# Patient Record
Sex: Male | Born: 2005 | Race: White | Hispanic: No | Marital: Single | Smoking: Never smoker
Health system: Southern US, Community
[De-identification: ages and names within clinical notes are randomized; demographics above are authoritative.]

## PROBLEM LIST (undated history)

## (undated) DIAGNOSIS — F840 Autistic disorder: Secondary | ICD-10-CM

## (undated) DIAGNOSIS — F909 Attention-deficit hyperactivity disorder, unspecified type: Secondary | ICD-10-CM

## (undated) HISTORY — DX: Attention-deficit hyperactivity disorder, unspecified type: F90.9

## (undated) HISTORY — DX: Autistic disorder: F84.0

---
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
## 2024-01-20 ENCOUNTER — Ambulatory Visit: Admitting: Family

## 2024-01-20 ENCOUNTER — Encounter: Payer: Self-pay | Admitting: Family

## 2024-01-20 VITALS — BP 108/72 | HR 76 | Ht 70.0 in | Wt 262.6 lb

## 2024-01-20 DIAGNOSIS — L0591 Pilonidal cyst without abscess: Secondary | ICD-10-CM | POA: Diagnosis not present

## 2024-01-20 MED ORDER — AMOXICILLIN-POT CLAVULANATE 875-125 MG PO TABS: 1.0000 | ORAL_TABLET | Freq: Two times a day (BID) | ORAL | 0 refills | Status: AC

## 2024-01-20 NOTE — Progress Notes (Signed)
  Matthew Bentley is a 18 y.o. 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 14 tablet 0   No current facility-administered medications for this visit.    Allergies: Patient has no known allergies.  Past Medical History:  Diagnosis Date   ADHD (attention deficit hyperactivity disorder)    Autism     No past surgical history on file.  No family history on file.  Social History   Tobacco Use   Smoking status: Never   Smokeless tobacco: Never  Substance Use Topics   Alcohol use: No    Subjective:   Presents today as a new patient; accompanied by mother; seen at [facility] 2 months ago and diagnosed with pilonidal cyst; was treated with course of antibiotics; unfortunately, symptoms have persisted and area of concern continuing to drain;  Works full time in land scaping; no fever;   Objective:  Vitals:   01/20/24 1459  BP: 108/72  Pulse: 76  SpO2: 98%  Weight: 262 lb 9.6 oz (119.1 kg)  Height: 5' 10 (1.778 m)    General: Well developed, well nourished, in no acute distress  Skin : Warm and dry. C/w pilonidal cyst noted at base of tailbone Head: Normocephalic and atraumatic  Lungs: Respirations unlabored;  Neurologic: Alert and oriented; speech intact; face symmetrical; moves all extremities well; CNII-XII intact without focal deficit   Assessment:  1. Pilonidal cyst     Plan:  Rx for Augmentin  875 mg bid x 7 days; referral to general surgery; follow up for CPE after surgical consult completed;   No follow-ups on file.  Orders Placed This Encounter  Procedures   Ambulatory referral to  General Surgery    Referral Priority:   Emergency    Referral Type:   Surgical    Referral Reason:   Specialty Services Required    Requested Specialty:   General Surgery    Number of Visits Requested:   1    Requested Prescriptions   Signed Prescriptions Disp Refills   amoxicillin -clavulanate (AUGMENTIN ) 875-125 MG tablet 14 tablet 0    Sig: Take 1 tablet by mouth 2 (two) times daily for 7 days.

## 2024-01-27 ENCOUNTER — Ambulatory Visit: Payer: Self-pay | Admitting: Surgery

## 2024-01-27 DIAGNOSIS — L0591 Pilonidal cyst without abscess: Secondary | ICD-10-CM | POA: Diagnosis not present

## 2024-01-27 NOTE — H&P (Signed)
 Matthew Bentley [id number]   Referring Provider:  Jason Leita Repine,*   Subjective   Chief Complaint: New Consultation (Pilonidal cyst)     History of Present Illness:    18 year old male presents for evaluation of a pilonidal cyst.  This has been treated with antibiotics from an urgent care about 2 months ago and recently established care with a PCP and noted at the time the symptoms had persisted with an ongoing area of drainage.  Another round of antibiotics was initiated on 7/11.  Has not really noticed a difference with this. Denies any prior symptoms or procedures in this area.  He works outdoors in Aeronautical engineer.   Review of Systems: A complete review of systems was obtained from the patient.  I have reviewed this information and discussed as appropriate with the patient.  See HPI as well for other ROS.   Medical History: Past Medical History:  Diagnosis Date   Anxiety     There is no problem list on file for this patient.   History reviewed. No pertinent surgical history.   No Known Allergies  No current outpatient medications on file prior to visit.   No current facility-administered medications on file prior to visit.    History reviewed. No pertinent family history.   Social History   Tobacco Use  Smoking Status Never  Smokeless Tobacco Never     Social History   Socioeconomic History   Marital status: Unknown  Tobacco Use   Smoking status: Never   Smokeless tobacco: Never  Vaping Use   Vaping status: Never Used  Substance and Sexual Activity   Alcohol use: Never   Drug use: Never   Social Drivers of Health   Housing Stability: Unknown (01/27/2024)   Housing Stability Vital Sign    Homeless in the Last Year: No    Objective:    Vitals:   01/27/24 1543  BP: (!) 118/82  Pulse: 102  Temp: 36.7 C (98 F)  SpO2: 97%  Weight: (!) 118.4 kg (261 lb)  Height: 177.8 cm (5' 10)  PainSc: 0-No pain    Body mass index is 37.45  kg/m.  Gen: A&Ox3, no distress  Chest: respiratory effort is normal. Neuro: no gross deficit Psych: appropriate mood and affect, normal insight/judgment intact  Skin: warm and dry.  Approximately 1 cm protuberant area of granulation and mild drainage to the left of the superior natal cleft.  Inferior to this are several midline pits containing hair.    Assessment and Plan:  Diagnoses and all orders for this visit:  Pilonidal cyst    We discussed the etiology and natural history of this disease and options for treatment. I recommend trephination and went over the procedure with the patient in detail as well as typical postoperative recovery and timeline. We discussed risks of bleeding, infection, pain, scarring, wound healing problems, recurrent pilonidal disease. Questions welcomed and answered. The patient wishes to proceed.      Mitzie Freund MD FACS

## 2024-01-27 NOTE — H&P (View-Only) (Signed)
 Matthew Bentley [id number]   Referring Provider:  Jason Leita Repine,*   Subjective   Chief Complaint: New Consultation (Pilonidal cyst)     History of Present Illness:    18 year old male presents for evaluation of a pilonidal cyst.  This has been treated with antibiotics from an urgent care about 2 months ago and recently established care with a PCP and noted at the time the symptoms had persisted with an ongoing area of drainage.  Another round of antibiotics was initiated on 7/11.  Has not really noticed a difference with this. Denies any prior symptoms or procedures in this area.  He works outdoors in Aeronautical engineer.   Review of Systems: A complete review of systems was obtained from the patient.  I have reviewed this information and discussed as appropriate with the patient.  See HPI as well for other ROS.   Medical History: Past Medical History:  Diagnosis Date   Anxiety     There is no problem list on file for this patient.   History reviewed. No pertinent surgical history.   No Known Allergies  No current outpatient medications on file prior to visit.   No current facility-administered medications on file prior to visit.    History reviewed. No pertinent family history.   Social History   Tobacco Use  Smoking Status Never  Smokeless Tobacco Never     Social History   Socioeconomic History   Marital status: Unknown  Tobacco Use   Smoking status: Never   Smokeless tobacco: Never  Vaping Use   Vaping status: Never Used  Substance and Sexual Activity   Alcohol use: Never   Drug use: Never   Social Drivers of Health   Housing Stability: Unknown (01/27/2024)   Housing Stability Vital Sign    Homeless in the Last Year: No    Objective:    Vitals:   01/27/24 1543  BP: (!) 118/82  Pulse: 102  Temp: 36.7 C (98 F)  SpO2: 97%  Weight: (!) 118.4 kg (261 lb)  Height: 177.8 cm (5' 10)  PainSc: 0-No pain    Body mass index is 37.45  kg/m.  Gen: A&Ox3, no distress  Chest: respiratory effort is normal. Neuro: no gross deficit Psych: appropriate mood and affect, normal insight/judgment intact  Skin: warm and dry.  Approximately 1 cm protuberant area of granulation and mild drainage to the left of the superior natal cleft.  Inferior to this are several midline pits containing hair.    Assessment and Plan:  Diagnoses and all orders for this visit:  Pilonidal cyst    We discussed the etiology and natural history of this disease and options for treatment. I recommend trephination and went over the procedure with the patient in detail as well as typical postoperative recovery and timeline. We discussed risks of bleeding, infection, pain, scarring, wound healing problems, recurrent pilonidal disease. Questions welcomed and answered. The patient wishes to proceed.      Mitzie Freund MD FACS

## 2024-02-10 NOTE — Progress Notes (Addendum)
 COVID Vaccine Completed:  Date of COVID positive in last 90 days:  PCP - Leita Elbe, FNP Cardiologist - n/a  Chest x-ray - n/a EKG - n/a Stress Test - n/a ECHO - n/a Cardiac Cath - n/a Pacemaker/ICD device last checked: n/a Spinal Cord Stimulator: n/a  Bowel Prep - no  Sleep Study - n/a CPAP -   Fasting Blood Sugar - n/a Checks Blood Sugar _____ times a day  Last dose of GLP1 agonist-  N/A GLP1 instructions:  Do not take after     Last dose of SGLT-2 inhibitors-  N/A SGLT-2 instructions:  Do not take after     Blood Thinner Instructions:  Last dose: n/a  Time: Aspirin Instructions: Last Dose:  Activity level: Can go up a flight of stairs and perform activities of daily living without stopping and without symptoms of chest pain or shortness of breath.   Anesthesia review:   Patient denies shortness of breath, fever, cough and chest pain at PAT appointment  Patient verbalized understanding of instructions that were given to them at the PAT appointment. Patient was also instructed that they will need to review over the PAT instructions again at home before surgery.

## 2024-02-10 NOTE — Patient Instructions (Addendum)
 SURGICAL WAITING ROOM VISITATION  Patients having surgery or a procedure may have no more than 2 support people in the waiting area - these visitors may rotate.    Children under the age of 55 must have an adult with them who is not the patient.  Visitors with respiratory illnesses are discouraged from visiting and should remain at home.  If the patient needs to stay at the hospital during part of their recovery, the visitor guidelines for inpatient rooms apply. Pre-op nurse will coordinate an appropriate time for 1 support person to accompany patient in pre-op.  This support person may not rotate.    Please refer to the [facility] website for the visitor guidelines for Inpatients (after your surgery is over and you are in a regular room).    Your procedure is scheduled on: 02/21/24   Report to [facility] Main Entrance    Report to admitting at 5:15 AM   Call this number if you have problems the morning of surgery [phone]   Do not eat food or drink liquids :After Midnight.          If you have questions, please contact your surgeon's office.   FOLLOW BOWEL PREP AND ANY ADDITIONAL PRE OP INSTRUCTIONS YOU RECEIVED FROM YOUR SURGEON'S OFFICE!!!     Oral Hygiene is also important to reduce your risk of infection.                                    Remember - BRUSH YOUR TEETH THE MORNING OF SURGERY WITH YOUR REGULAR TOOTHPASTE  DENTURES WILL BE REMOVED PRIOR TO SURGERY PLEASE DO NOT APPLY Poly grip OR ADHESIVES!!!   Stop all vitamins and herbal supplements 7 days before surgery.   Take these medicines the morning of surgery with A SIP OF WATER: None                               You may not have any metal on your body including hair pins, jewelry, and body piercing             Do not wear lotions, powders, cologne, or deodorant              Men may shave face and neck.   Do not bring valuables to the hospital. [facility] IS NOT             RESPONSIBLE   FOR  VALUABLES.   Contacts, glasses, dentures or bridgework may not be worn into surgery.  DO NOT BRING YOUR HOME MEDICATIONS TO THE HOSPITAL. PHARMACY WILL DISPENSE MEDICATIONS LISTED ON YOUR MEDICATION LIST TO YOU DURING YOUR ADMISSION IN THE HOSPITAL!    Patients discharged on the day of surgery will not be allowed to drive home.  Someone NEEDS to stay with you for the first 24 hours after anesthesia.              Please read over the following fact sheets you were given: IF YOU HAVE QUESTIONS ABOUT YOUR PRE-OP INSTRUCTIONS PLEASE CALL [phone].   If you received a COVID test during your pre-op visit  it is requested that you wear a mask when out in public, stay away from anyone that may not be feeling well and notify your surgeon if you develop symptoms. If you test positive for Covid or have  been in contact with anyone that has tested positive in the last 10 days please notify you surgeon.    [facility] - Preparing for Surgery Before surgery, you can play an important role.  Because skin is not sterile, your skin needs to be as free of germs as possible.  You can reduce the number of germs on your skin by washing with CHG (chlorahexidine gluconate) soap before surgery.  CHG is an antiseptic cleaner which kills germs and bonds with the skin to continue killing germs even after washing. Please DO NOT use if you have an allergy to CHG or antibacterial soaps.  If your skin becomes reddened/irritated stop using the CHG and inform your nurse when you arrive at Short Stay. Do not shave (including legs and underarms) for at least 48 hours prior to the first CHG shower.  You may shave your face/neck.  Please follow these instructions carefully:  1.  Shower with CHG Soap the night before surgery and the  morning of surgery.  2.  If you choose to wash your hair, wash your hair first as usual with your normal  shampoo.  3.  After you shampoo, rinse your hair and body thoroughly to remove the  shampoo.                             4.  Use CHG as you would any other liquid soap.  You can apply chg directly to the skin and wash.  Gently with a scrungie or clean washcloth.  5.  Apply the CHG Soap to your body ONLY FROM THE NECK DOWN.   Do   not use on face/ open                           Wound or open sores. Avoid contact with eyes, ears mouth and   genitals (private parts).                       Wash face,  Genitals (private parts) with your normal soap.             6.  Wash thoroughly, paying special attention to the area where your    surgery  will be performed.  7.  Thoroughly rinse your body with warm water from the neck down.  8.  DO NOT shower/wash with your normal soap after using and rinsing off the CHG Soap.                9.  Pat yourself dry with a clean towel.            10.  Wear clean pajamas.            11.  Place clean sheets on your bed the night of your first shower and do not  sleep with pets. Day of Surgery : Do not apply any lotions/deodorants the morning of surgery.  Please wear clean clothes to the hospital/surgery center.  FAILURE TO FOLLOW THESE INSTRUCTIONS MAY RESULT IN THE CANCELLATION OF YOUR SURGERY  PATIENT SIGNATURE_________________________________  NURSE SIGNATURE__________________________________  ________________________________________________________________________

## 2024-02-14 ENCOUNTER — Encounter (HOSPITAL_COMMUNITY)
Admission: RE | Admit: 2024-02-14 | Discharge: 2024-02-14 | Disposition: A | Discharge status: Home / Self Care | Source: Ambulatory Visit | Attending: Surgery | Admitting: Surgery

## 2024-02-14 ENCOUNTER — Encounter (HOSPITAL_COMMUNITY): Payer: Self-pay

## 2024-02-14 ENCOUNTER — Other Ambulatory Visit: Payer: Self-pay

## 2024-02-14 VITALS — BP 124/60 | HR 70 | Temp 98.1°F | Resp 16 | Ht 70.0 in | Wt 236.0 lb

## 2024-02-14 DIAGNOSIS — Z01812 Encounter for preprocedural laboratory examination: Secondary | ICD-10-CM | POA: Diagnosis present

## 2024-02-14 DIAGNOSIS — Z01818 Encounter for other preprocedural examination: Secondary | ICD-10-CM

## 2024-02-14 LAB — CBC
HCT: 47.2 % (ref 39.0–52.0)
Hemoglobin: 15 g/dL (ref 13.0–17.0)
MCH: 28.2 pg (ref 26.0–34.0)
MCHC: 31.8 g/dL (ref 30.0–36.0)
MCV: 88.9 fL (ref 80.0–100.0)
Platelets: 313 K/uL (ref 150–400)
RBC: 5.31 MIL/uL (ref 4.22–5.81)
RDW: 11.9 % (ref 11.5–15.5)
WBC: 6.9 K/uL (ref 4.0–10.5)
nRBC: 0 % (ref 0.0–0.2)

## 2024-02-20 NOTE — Anesthesia Preprocedure Evaluation (Addendum)
 Anesthesia Evaluation  Patient identified by MRN, date of birth, ID band Patient awake    Reviewed: Allergy & Precautions, H&P , NPO status , Patient's Chart, lab work & pertinent test results  Airway Mallampati: III  TM Distance: >3 FB Neck ROM: Full    Dental no notable dental hx. (+) Teeth Intact, Dental Advisory Given   Pulmonary neg pulmonary ROS   Pulmonary exam normal breath sounds clear to auscultation       Cardiovascular Exercise Tolerance: Good negative cardio ROS  Rhythm:Regular Rate:Normal     Neuro/Psych negative neurological ROS  negative psych ROS   GI/Hepatic negative GI ROS, Neg liver ROS,,,  Endo/Other  negative endocrine ROS    Renal/GU negative Renal ROS  negative genitourinary   Musculoskeletal   Abdominal   Peds  (+) ADHD Hematology negative hematology ROS (+)   Anesthesia Other Findings   Reproductive/Obstetrics negative OB ROS                              Anesthesia Physical Anesthesia Plan  ASA: 2  Anesthesia Plan: General   Post-op Pain Management: Tylenol  PO (pre-op)* and Toradol IV (intra-op)*   Induction: Intravenous  PONV Risk Score and Plan: 3 and Dexamethasone  and Midazolam   Airway Management Planned: Oral ETT  Additional Equipment:   Intra-op Plan:   Post-operative Plan: Extubation in OR  Informed Consent: I have reviewed the patients History and Physical, chart, labs and discussed the procedure including the risks, benefits and alternatives for the proposed anesthesia with the patient or authorized representative who has indicated his/her understanding and acceptance.     Dental advisory given  Plan Discussed with: CRNA  Anesthesia Plan Comments:          Anesthesia Quick Evaluation

## 2024-02-21 ENCOUNTER — Ambulatory Visit (HOSPITAL_COMMUNITY): Payer: Self-pay | Admitting: Anesthesiology

## 2024-02-21 ENCOUNTER — Encounter (HOSPITAL_COMMUNITY): Payer: Self-pay | Admitting: Surgery

## 2024-02-21 ENCOUNTER — Ambulatory Visit (HOSPITAL_COMMUNITY)
Admission: RE | Admit: 2024-02-21 | Discharge: 2024-02-21 | Disposition: A | Discharge status: Home / Self Care | Attending: Surgery | Admitting: Surgery

## 2024-02-21 ENCOUNTER — Other Ambulatory Visit: Payer: Self-pay

## 2024-02-21 ENCOUNTER — Ambulatory Visit (HOSPITAL_COMMUNITY): Payer: Self-pay | Admitting: Medical

## 2024-02-21 ENCOUNTER — Encounter (HOSPITAL_COMMUNITY)
Admission: RE | Disposition: A | Discharge status: Home / Self Care | Payer: Self-pay | Source: Home / Self Care | Attending: Surgery

## 2024-02-21 DIAGNOSIS — L0591 Pilonidal cyst without abscess: Secondary | ICD-10-CM | POA: Diagnosis present

## 2024-02-21 DIAGNOSIS — F909 Attention-deficit hyperactivity disorder, unspecified type: Secondary | ICD-10-CM | POA: Diagnosis not present

## 2024-02-21 HISTORY — PX: PILONIDAL CYST DRAINAGE: SHX743

## 2024-02-21 SURGERY — EXCISION, PILONIDAL CYST
Anesthesia: General

## 2024-02-21 MED ORDER — ROCURONIUM BROMIDE 10 MG/ML (PF) SYRINGE
PREFILLED_SYRINGE | INTRAVENOUS | Status: DC | PRN
  Administered 2024-02-21 (×2): 40 mg via INTRAVENOUS

## 2024-02-21 MED ORDER — BUPIVACAINE LIPOSOME 1.3 % IJ SUSP: 20.0000 mL | Freq: Once | INTRAMUSCULAR | Status: DC

## 2024-02-21 MED ORDER — BUPIVACAINE-EPINEPHRINE (PF) 0.25% -1:200000 IJ SOLN
INTRAMUSCULAR | Status: AC
  Filled 2024-02-21: qty 30

## 2024-02-21 MED ORDER — MIDAZOLAM HCL 2 MG/2ML IJ SOLN
INTRAMUSCULAR | Status: DC | PRN
  Administered 2024-02-21 (×2): 2 mg via INTRAVENOUS

## 2024-02-21 MED ORDER — 0.9 % SODIUM CHLORIDE (POUR BTL) OPTIME
TOPICAL | Status: DC | PRN
Start: 2024-02-21 — End: 2024-02-21
  Administered 2024-02-21 (×2): 1000 mL

## 2024-02-21 MED ORDER — LACTATED RINGERS IV SOLN: INTRAVENOUS | Status: DC

## 2024-02-21 MED ORDER — ONDANSETRON HCL 4 MG/2ML IJ SOLN
INTRAMUSCULAR | Status: DC | PRN
  Administered 2024-02-21 (×2): 4 mg via INTRAVENOUS

## 2024-02-21 MED ORDER — ROCURONIUM BROMIDE 10 MG/ML (PF) SYRINGE
PREFILLED_SYRINGE | INTRAVENOUS | Status: AC
  Filled 2024-02-21: qty 10

## 2024-02-21 MED ORDER — ACETAMINOPHEN 500 MG PO TABS
1000.0000 mg | ORAL_TABLET | ORAL | Status: AC
  Administered 2024-02-21 (×2): 1000 mg via ORAL
  Filled 2024-02-21: qty 2

## 2024-02-21 MED ORDER — SUGAMMADEX SODIUM 200 MG/2ML IV SOLN
INTRAVENOUS | Status: DC | PRN
  Administered 2024-02-21 (×2): 200 mg via INTRAVENOUS

## 2024-02-21 MED ORDER — CEFAZOLIN SODIUM-DEXTROSE 2-4 GM/100ML-% IV SOLN
2.0000 g | INTRAVENOUS | Status: AC
  Administered 2024-02-21 (×2): 2 g via INTRAVENOUS
  Filled 2024-02-21: qty 100

## 2024-02-21 MED ORDER — CHLORHEXIDINE GLUCONATE 4 % EX SOLN: 60.0000 mL | Freq: Once | CUTANEOUS | Status: DC

## 2024-02-21 MED ORDER — HYDROGEN PEROXIDE 3 % EX SOLN
CUTANEOUS | Status: AC
  Filled 2024-02-21: qty 473

## 2024-02-21 MED ORDER — LIDOCAINE HCL (PF) 2 % IJ SOLN
INTRAMUSCULAR | Status: AC
  Filled 2024-02-21: qty 5

## 2024-02-21 MED ORDER — FENTANYL CITRATE (PF) 250 MCG/5ML IJ SOLN
INTRAMUSCULAR | Status: AC
Start: 2024-02-21 — End: 2024-02-21
  Filled 2024-02-21: qty 5

## 2024-02-21 MED ORDER — TRAMADOL HCL 50 MG PO TABS: 50.0000 mg | ORAL_TABLET | Freq: Four times a day (QID) | ORAL | 0 refills | Status: AC | PRN

## 2024-02-21 MED ORDER — ORAL CARE MOUTH RINSE: 15.0000 mL | Freq: Once | OROMUCOSAL | Status: AC

## 2024-02-21 MED ORDER — FENTANYL CITRATE (PF) 250 MCG/5ML IJ SOLN
INTRAMUSCULAR | Status: DC | PRN
  Administered 2024-02-21 (×2): 150 ug via INTRAVENOUS
  Administered 2024-02-21 (×2): 100 ug via INTRAVENOUS

## 2024-02-21 MED ORDER — BUPIVACAINE-EPINEPHRINE (PF) 0.25% -1:200000 IJ SOLN
INTRAMUSCULAR | Status: DC | PRN
  Administered 2024-02-21: 40 mL
  Administered 2024-02-21: 10 mL via TOPICAL
  Administered 2024-02-21: 40 mL
  Administered 2024-02-21: 10 mL via TOPICAL

## 2024-02-21 MED ORDER — DEXAMETHASONE SODIUM PHOSPHATE 10 MG/ML IJ SOLN
INTRAMUSCULAR | Status: AC
  Filled 2024-02-21: qty 1

## 2024-02-21 MED ORDER — DEXAMETHASONE SODIUM PHOSPHATE 10 MG/ML IJ SOLN
INTRAMUSCULAR | Status: DC | PRN
  Administered 2024-02-21 (×2): 10 mg via INTRAVENOUS

## 2024-02-21 MED ORDER — BUPIVACAINE LIPOSOME 1.3 % IJ SUSP
INTRAMUSCULAR | Status: AC
  Filled 2024-02-21: qty 20

## 2024-02-21 MED ORDER — PROPOFOL 10 MG/ML IV BOLUS
INTRAVENOUS | Status: AC
  Filled 2024-02-21: qty 20

## 2024-02-21 MED ORDER — LIDOCAINE HCL (CARDIAC) PF 100 MG/5ML IV SOSY
PREFILLED_SYRINGE | INTRAVENOUS | Status: DC | PRN
Start: 2024-02-21 — End: 2024-02-21
  Administered 2024-02-21 (×2): 100 mg via INTRAVENOUS

## 2024-02-21 MED ORDER — GABAPENTIN 300 MG PO CAPS
300.0000 mg | ORAL_CAPSULE | ORAL | Status: AC
  Administered 2024-02-21 (×2): 300 mg via ORAL
  Filled 2024-02-21: qty 1

## 2024-02-21 MED ORDER — HYDROGEN PEROXIDE 3 % EX SOLN
CUTANEOUS | Status: DC | PRN
  Administered 2024-02-21 (×2): 1

## 2024-02-21 MED ORDER — PROPOFOL 10 MG/ML IV BOLUS
INTRAVENOUS | Status: DC | PRN
  Administered 2024-02-21 (×2): 200 mg via INTRAVENOUS

## 2024-02-21 MED ORDER — DOCUSATE SODIUM 100 MG PO CAPS: 100.0000 mg | ORAL_CAPSULE | Freq: Two times a day (BID) | ORAL | 0 refills | Status: AC

## 2024-02-21 MED ORDER — SUGAMMADEX SODIUM 200 MG/2ML IV SOLN
INTRAVENOUS | Status: AC
  Filled 2024-02-21: qty 2

## 2024-02-21 MED ORDER — ONDANSETRON HCL 4 MG/2ML IJ SOLN
INTRAMUSCULAR | Status: AC
  Filled 2024-02-21: qty 2

## 2024-02-21 MED ORDER — MIDAZOLAM HCL 2 MG/2ML IJ SOLN
INTRAMUSCULAR | Status: AC
  Filled 2024-02-21: qty 2

## 2024-02-21 MED ORDER — CHLORHEXIDINE GLUCONATE 0.12 % MT SOLN
15.0000 mL | Freq: Once | OROMUCOSAL | Status: AC
  Administered 2024-02-21 (×2): 15 mL via OROMUCOSAL

## 2024-02-21 SURGICAL SUPPLY — 30 items
BENZOIN TINCTURE PRP APPL 2/3 (GAUZE/BANDAGES/DRESSINGS) IMPLANT
BLADE EXTENDED COATED 6.5IN (ELECTRODE) IMPLANT
BLADE SURG 15 STRL LF DISP TIS (BLADE) ×1 IMPLANT
BRIEF MESH DISP LRG (UNDERPADS AND DIAPERS) IMPLANT
COVER SURGICAL LIGHT HANDLE (MISCELLANEOUS) ×1 IMPLANT
DRAIN PENROSE 0.25X18 (DRAIN) IMPLANT
DRAPE LAPAROTOMY T 98X78 PEDS (DRAPES) ×1 IMPLANT
DRAPE UTILITY XL STRL (DRAPES) ×1 IMPLANT
ELECT REM PT RETURN 15FT ADLT (MISCELLANEOUS) ×1 IMPLANT
GAUZE 4X4 16PLY ~~LOC~~+RFID DBL (SPONGE) ×1 IMPLANT
GAUZE PAD ABD 8X10 STRL (GAUZE/BANDAGES/DRESSINGS) ×1 IMPLANT
GAUZE SPONGE 4X4 12PLY STRL (GAUZE/BANDAGES/DRESSINGS) ×1 IMPLANT
GLOVE BIO SURGEON STRL SZ 6 (GLOVE) ×1 IMPLANT
GLOVE INDICATOR 6.5 STRL GRN (GLOVE) ×1 IMPLANT
GOWN STRL REUS W/ TWL LRG LVL3 (GOWN DISPOSABLE) ×1 IMPLANT
IV CATH 14GX2 1/4 (CATHETERS) IMPLANT
KIT BASIN OR (CUSTOM PROCEDURE TRAY) ×1 IMPLANT
KIT TURNOVER KIT A (KITS) ×1 IMPLANT
NDL HYPO 25X1 1.5 SAFETY (NEEDLE) ×1 IMPLANT
NEEDLE HYPO 25X1 1.5 SAFETY (NEEDLE) ×1 IMPLANT
PACK BASIC VI WITH GOWN DISP (CUSTOM PROCEDURE TRAY) ×1 IMPLANT
PENCIL SMOKE EVACUATOR (MISCELLANEOUS) ×1 IMPLANT
PUNCH BIOPSY 3 (MISCELLANEOUS) IMPLANT
SUT CHROMIC 3 0 SH 27 (SUTURE) IMPLANT
SYR 20ML LL LF (SYRINGE) IMPLANT
SYR CONTROL 10ML LL (SYRINGE) ×1 IMPLANT
TAPE CLOTH SURG 4X10 WHT LF (GAUZE/BANDAGES/DRESSINGS) IMPLANT
TOWEL OR 17X26 10 PK STRL BLUE (TOWEL DISPOSABLE) ×2 IMPLANT
TUBING CONNECTING 10 (TUBING) ×1 IMPLANT
YANKAUER SUCT BULB TIP NO VENT (SUCTIONS) ×1 IMPLANT

## 2024-02-21 NOTE — Transfer of Care (Signed)
 Immediate Anesthesia Transfer of Care Note  Patient: Matthew Bentley  Procedure(s) Performed: TREPHINATION OF PILONIDAL CYST  Patient Location: PACU  Anesthesia Type:General  Level of Consciousness: awake and patient cooperative  Airway & Oxygen Therapy: Patient Spontanous Breathing and Patient connected to face mask oxygen  Post-op Assessment: Report given to RN and Post -op Vital signs reviewed and stable  Post vital signs: Reviewed and stable  Last Vitals:  Vitals Value Taken Time  BP 148/79 02/21/24 08:37  Temp    Pulse 111 02/21/24 08:38  Resp 16 02/21/24 08:38  SpO2 100 % 02/21/24 08:38  Vitals shown include unfiled device data.  Last Pain:  Vitals:   02/21/24 0537  TempSrc:   PainSc: 0-No pain         Complications: No notable events documented.

## 2024-02-21 NOTE — Anesthesia Procedure Notes (Signed)
 Procedure Name: Intubation Date/Time: 02/21/2024 7:22 AM  Performed by: Dasie Nena PARAS, CRNAPre-anesthesia Checklist: Patient identified, Emergency Drugs available, Suction available, Patient being monitored and Timeout performed Patient Re-evaluated:Patient Re-evaluated prior to induction Oxygen Delivery Method: Circle system utilized Preoxygenation: Pre-oxygenation with 100% oxygen Induction Type: IV induction Ventilation: Mask ventilation without difficulty Laryngoscope Size: Miller and 3 Grade View: Grade I Tube type: Oral Tube size: 7.5 mm Number of attempts: 1 Airway Equipment and Method: Stylet Placement Confirmation: ETT inserted through vocal cords under direct vision, positive ETCO2 and breath sounds checked- equal and bilateral Secured at: 24 cm Tube secured with: Tape Dental Injury: Teeth and Oropharynx as per pre-operative assessment

## 2024-02-21 NOTE — Anesthesia Postprocedure Evaluation (Signed)
 Anesthesia Post Note  Patient: Matthew Bentley  Procedure(s) Performed: TREPHINATION OF PILONIDAL CYST     Patient location during evaluation: PACU Anesthesia Type: General Level of consciousness: awake and alert Pain management: pain level controlled Vital Signs Assessment: post-procedure vital signs reviewed and stable Respiratory status: spontaneous breathing, nonlabored ventilation and respiratory function stable Cardiovascular status: blood pressure returned to baseline and stable Postop Assessment: no apparent nausea or vomiting Anesthetic complications: no   No notable events documented.  Last Vitals:  Vitals:   02/21/24 0845 02/21/24 0900  BP: (!) 141/64 (!) 120/51  Pulse: (!) 105 (!) 105  Resp: 17 19  Temp:  36.6 C  SpO2: 100% 91%    Last Pain:  Vitals:   02/21/24 0900  TempSrc:   PainSc: 0-No pain                 Aili Casillas,W. EDMOND

## 2024-02-21 NOTE — Discharge Instructions (Addendum)
 Postoperative Instructions Surgery for Pilonidal Disease Restrictions  You may shower after 48 hours but you should avoid soaking in water for more than ten minutes.  There are no dietary restrictions.  You should avoid alcohol while you are on narcotic pain medication.  Activity can be as tolerated.  Wound care - Your incision was intentionally left open. 48 hours after surgery, remove all dressings from the area. You should then shower and let soap and water run over the area then pat dry. You should then cover the incision with gauze, secured with tape. This should be changed AT LEAST daily or whenever the dressing gets wet. The wound will heal over the next few weeks.  - Shower daily after this and ensure that you are cleansing the area with soap and water.  - Hair removal using clippers, waxing or depilatory creams around the area will help reduce the chance of recurrence. This can be initiated once the wound starts to heal.    Medications  You will be given a prescription for pain medicine when you are discharged from the hospital. In addition to the prescription pain medicine that you were given, you may take Motrin, Advil, or ibuprofen at the same time. This often gives better pain relief than either one by itself.  Stop the prescription and switch to Motrin, Advil, or ibuprofen as soon as these medications can control your pain. (This will reduce your risk of constipation.) -Most patients will experience some swelling and bruising around the incisions.  Ice packs or heating pads (30-60 minutes up to 6 times a day) will help. Use ice for the first few days to help decrease swelling and bruising, then switch to heat to help relax tight/sore spots and speed recovery.  Some people prefer to use ice alone, heat alone, alternating between ice & heat.  Experiment to what works for you.  Swelling and bruising can take several weeks to resolve.  Take Colace 100 mg two times daily until you  are having regular bowel movements and no longer taking the prescription pain medication.  Call the office if:  The pain worsens and you need to increase the amount of pain medication.  You experience persistent fever or chills. (You may have lowgrade fevers on and off for the days following surgery? this is your body's normal reaction to surgery.)  There is redness of more than  inch around the incisions.  There is drainage of cloudy fluid or pus (Drainage of a yellowish bloody fluid is normal.)  You experience persistent nausea or vomiting.   Please call the office [phone]) to make a followup appointment for one to two weeks after surgery and if you have any other problems, questions, or concerns.   The clinic staff is available to answer your questions during regular business hours (8:30am-5pm).  Please don't hesitate to call and ask to speak to one of our nurses for clinical concerns.              If you have disability or family leave forms, bring them to the office for processing. Do not give them to your doctor.  If you have a medical emergency, go to the nearest emergency room or call 911.  A surgeon from [facility] is always on call at the [facility], [place] [address], [place], [place]  [address] ? MAIN: [phone] ? TOLL FREE: [phone] ?  FAX [phone] [email or web address]

## 2024-02-21 NOTE — Op Note (Addendum)
 Operative Note  Matthew Bentley  [id number]  [id number]  02/21/2024   Surgeon: Mitzie Freund MD FACS  I was personally present during the key and critical portions of this procedure and immediately available throughout the entire procedure, as documented in my operative note.    Assistant: Eva Barrier MD (PGY4)   Procedure performed: trephination of pilonidal cyst (Gips procedure)   Preop diagnosis: pilonidal disease Post-op diagnosis/intraop findings: Same, with extensive subcutaneous tracts and multiple midline pits, erosion of the dermis at the inferior aspect of the natal cleft from chronic inflammation   Specimens: no Retained items: no  EBL: 15cc Complications: none   Description of procedure: After obtaining informed consent the patient was taken to the operating room and placed supine on operating room table where general endotracheal anesthesia was initiated, preoperative antibiotics were administered, SCDs applied, and a formal timeout was performed.  Patient was then repositioned prone with all pressure points appropriately padded.  The buttocks were gently spread apart with tape and then the buttocks and natal cleft were clipped, prepped and draped in usual sterile fashion.  A field block was performed with Exparel  mixed with core percent Marcaine  with epinephrine .  On inspection, there is a dominant cyst at the superior aspect of the natal cleft to the left of midline and then at least 4 midline pits with extruding hair.  Inferior to these pits, there is an area of skin breakdown overlying additional sinus tracts from the pilonidal disease.  Chronic granulation and anterior cyst wall at the dominant superior cyst were excised with cautery and then the midline pits were excised with a 3 mm punch.  Extensive amount of inspissated hair and chronic regulation tissue was then debrided with accommodation of forceps, Ray-Tec, and curette debridement.  At the inferior aspect of the  cleft where there was skin breakdown, this tissue essentially disintegrated with recheck debridement, unroofing a shallow sinus tract that extended slightly to the patient's left.  Once we had extracted all appreciable hair and granulation tissue, the wound was flushed by saline, followed by hydrogen peroxide  followed by a final saline flush.  Hemostasis was ensured with cautery.  Inferior wound is approximately 8 cm cephalocaudad by 2 cm wide by 1.5 cm maximal depth; the wound at the superior aspect is approximately 2 x 2 cm by about 2 cm deep and connects to all of the more inferior openings The wounds were lightly packed with a piece of 4 x 4 gauze dampened with residual local and this was covered with 4 x 4's, ABD and tape.  The patient was then returned to the supine position, awakened, extubated and taken to PACU in stable condition.    All counts were correct at the completion of the case.

## 2024-02-21 NOTE — Interval H&P Note (Signed)
 History and Physical Interval Note:  02/21/2024 6:54 AM  Matthew Bentley  has presented today for surgery, with the diagnosis of PILONIDAL CYST.  The various methods of treatment have been discussed with the patient and family. After consideration of risks, benefits and other options for treatment, the patient has consented to  Procedure(s) with comments: EXCISION, PILONIDAL CYST (N/A) - TREPHINATION OF PILONIDAL CYST as a surgical intervention.  The patient's history has been reviewed, patient examined, no change in status, stable for surgery.  I have reviewed the patient's chart and labs.  Questions were answered to the patient's satisfaction.     Loyd Marhefka DELENA Freund

## 2024-02-22 ENCOUNTER — Encounter (HOSPITAL_COMMUNITY): Payer: Self-pay | Admitting: Surgery
